# Patient Record
Sex: Male | Born: 1980
Health system: Southern US, Community
[De-identification: ages and names within clinical notes are randomized; demographics above are authoritative.]

---

## 2005-05-01 HISTORY — PX: OTHER SURGICAL HISTORY: SHX169

## 2016-01-11 ENCOUNTER — Encounter: Payer: Self-pay | Admitting: Adult Health

## 2016-01-11 ENCOUNTER — Ambulatory Visit (INDEPENDENT_AMBULATORY_CARE_PROVIDER_SITE_OTHER): Payer: BLUE CROSS/BLUE SHIELD | Admitting: Adult Health

## 2016-01-11 VITALS — BP 122/78 | HR 66 | Temp 98.4°F | Ht 68.0 in | Wt 241.4 lb

## 2016-01-11 DIAGNOSIS — Z0000 Encounter for general adult medical examination without abnormal findings: Secondary | ICD-10-CM | POA: Diagnosis not present

## 2016-01-11 DIAGNOSIS — Z3169 Encounter for other general counseling and advice on procreation: Secondary | ICD-10-CM | POA: Diagnosis not present

## 2016-01-11 NOTE — Progress Notes (Signed)
Patient presents to clinic today to establish care. He is a pleasant 35 year old male who  has no past medical history on file.   His last physical was in 2013.    Acute Concerns: Complete physical exam   Concern for infertility  - He has having concerns for infertility. He and his girlfriend have been "casually" trying to get pregnant for over a year. He is concerned that he may be infertile due to not being able to get pregnant. He would like to see someone to get tested.   Chronic Issues: None   Health Maintenance: Dental -- Does not do routine care Vision -- Does not do routine care  Immunizations --Needs tdap and flu vaccination- does not want  Exercise: Goes to [employer], lifts weight and cardio x 3 week  Diet: He continues to work on diet. He is not eating out as much.       No past medical history on file.  Past Surgical History:  Procedure Laterality Date  . right hand surgery   2007    No current outpatient prescriptions on file prior to visit.   No current facility-administered medications on file prior to visit.     Not on File  Family History  Problem Relation Age of Onset  . Arthritis Mother   . Diabetes Father     Social History   Social History  . Marital status: Single    Spouse name: N/A  . Number of children: N/A  . Years of education: N/A   Occupational History  . [employer]   Social History Main Topics  . Smoking status: Former Smoker    Types: Cigarettes    Quit date: 06/30/2014  . Smokeless tobacco: Never Used  . Alcohol use 1.8 oz/week    3 Cans of beer per week     Comment: 1x1 month socially   . Drug use:     Types: Marijuana     Comment: stopped in 2006  . Sexual activity: Yes    Partners: Female    Birth control/ protection: None   Other Topics Concern  . Not on file   Social History Narrative  . No narrative on file    Review of Systems  Constitutional: Negative.   HENT:  Negative.   Respiratory: Negative.   Cardiovascular: Negative.   Gastrointestinal: Negative.   Genitourinary: Negative.   Musculoskeletal: Negative.   Skin: Negative.   Neurological: Negative.   Endo/Heme/Allergies: Negative.   Psychiatric/Behavioral: Negative.   All other systems reviewed and are negative.   BP 122/78 (BP Location: Left Arm, Patient Position: Sitting, Cuff Size: Small)   Pulse 66   Temp 98.4 F (36.9 C) (Oral)   Ht 5\' 8"  (1.727 m)   Wt 241 lb 6.4 oz (109.5 kg)   SpO2 99%   BMI 36.70 kg/m   Physical Exam  Constitutional: He is oriented to person, place, and time and well-developed, well-nourished, and in no distress. No distress.  obese  HENT:  Head: Normocephalic and atraumatic.  Right Ear: External ear normal.  Left Ear: External ear normal.  Nose: Nose normal.  Mouth/Throat: Oropharynx is clear and moist.  Eyes: Conjunctivae and EOM are normal. Pupils are equal, round, and reactive to light. Right eye exhibits no discharge. Left eye exhibits no discharge. No scleral icterus.  Neck: Normal range of motion. Neck supple. No JVD present. No tracheal deviation present. No thyromegaly present.  Cardiovascular: Normal  rate, regular rhythm, normal heart sounds and intact distal pulses.  Exam reveals no gallop and no friction rub.   No murmur heard. Pulmonary/Chest: Effort normal and breath sounds normal. No stridor. No respiratory distress. He has no wheezes. He has no rales. He exhibits no tenderness.  Abdominal: Soft. Bowel sounds are normal. He exhibits no distension and no mass. There is no tenderness. There is no rebound and no guarding.  Musculoskeletal: Normal range of motion. He exhibits no edema, tenderness or deformity.  Lymphadenopathy:    He has no cervical adenopathy.  Neurological: He is alert and oriented to person, place, and time. He has normal reflexes. He displays normal reflexes. No cranial nerve deficit. He exhibits normal muscle tone. Gait  normal. Coordination normal. GCS score is 15.  Skin: Skin is warm and dry. No rash noted. He is not diaphoretic. No erythema. No pallor.  Psychiatric: Mood, memory, affect and judgment normal.  Nursing note and vitals reviewed.   Assessment/Plan:  1. Routine general medical examination at a health care facility - POCT Urinalysis Dipstick (Automated); Future - Basic metabolic panel; Future - CBC with Differential/Platelet; Future - Hemoglobin A1c; Future - Hepatic function panel; Future - Lipid panel; Future - TSH; Future - Urine cytology ancillary only; Future - RPR; Future - HSV(herpes smplx)abs-1+2(IgG+IgM)-bld; Future - HIV antibody; Future - Encouraged heart healthy diet and continued exercise - Follow up in one year or sooner if needed  2. Infertility counseling - Ambulatory referral to Urology  Shirline Freesory Kaytlynn Kochan, NP

## 2016-01-11 NOTE — Patient Instructions (Addendum)
It was great meeting you today   Please return to have your labs done  Someone from Urology will call you to schedule an appointment   Follow up with me in a year or sooner if needed  Health Maintenance, Male A healthy lifestyle and preventative care can promote health and wellness.  Maintain regular health, dental, and eye exams.  Eat a healthy diet. Foods like vegetables, fruits, whole grains, low-fat dairy products, and lean protein foods contain the nutrients you need and are low in calories. Decrease your intake of foods high in solid fats, added sugars, and salt. Get information about a proper diet from your health care provider, if necessary.  Regular physical exercise is one of the most important things you can do for your health. Most adults should get at least 150 minutes of moderate-intensity exercise (any activity that increases your heart rate and causes you to sweat) each week. In addition, most adults need muscle-strengthening exercises on 2 or more days a week.   Maintain a healthy weight. The body mass index (BMI) is a screening tool to identify possible weight problems. It provides an estimate of body fat based on height and weight. Your health care provider can find your BMI and can help you achieve or maintain a healthy weight. For males 20 years and older:  A BMI below 18.5 is considered underweight.  A BMI of 18.5 to 24.9 is normal.  A BMI of 25 to 29.9 is considered overweight.  A BMI of 30 and above is considered obese.  Maintain normal blood lipids and cholesterol by exercising and minimizing your intake of saturated fat. Eat a balanced diet with plenty of fruits and vegetables. Blood tests for lipids and cholesterol should begin at age 35 and be repeated every 5 years. If your lipid or cholesterol levels are high, you are over age 35, or you are at high risk for heart disease, you may need your cholesterol levels checked more frequently.Ongoing high lipid and  cholesterol levels should be treated with medicines if diet and exercise are not working.  If you smoke, find out from your health care provider how to quit. If you do not use tobacco, do not start.  Lung cancer screening is recommended for adults aged 55-80 years who are at high risk for developing lung cancer because of a history of smoking. A yearly low-dose CT scan of the lungs is recommended for people who have at least a 30-pack-year history of smoking and are current smokers or have quit within the past 15 years. A pack year of smoking is smoking an average of 1 pack of cigarettes a day for 1 year (for example, a 30-pack-year history of smoking could mean smoking 1 pack a day for 30 years or 2 packs a day for 15 years). Yearly screening should continue until the smoker has stopped smoking for at least 15 years. Yearly screening should be stopped for people who develop a health problem that would prevent them from having lung cancer treatment.  If you choose to drink alcohol, do not have more than 2 drinks per day. One drink is considered to be 12 oz (360 mL) of beer, 5 oz (150 mL) of wine, or 1.5 oz (45 mL) of liquor.  Avoid the use of street drugs. Do not share needles with anyone. Ask for help if you need support or instructions about stopping the use of drugs.  High blood pressure causes heart disease and increases the risk of  stroke. High blood pressure is more likely to develop in:  People who have blood pressure in the end of the normal range (100-139/85-89 mm Hg).  People who are overweight or obese.  People who are African American.  If you are 39-18 years of age, have your blood pressure checked every 3-5 years. If you are 56 years of age or older, have your blood pressure checked every year. You should have your blood pressure measured twice--once when you are at a hospital or clinic, and once when you are not at a hospital or clinic. Record the average of the two measurements. To  check your blood pressure when you are not at a hospital or clinic, you can use:  An automated blood pressure machine at a pharmacy.  A home blood pressure monitor.  If you are 5-62 years old, ask your health care provider if you should take aspirin to prevent heart disease.  Diabetes screening involves taking a blood sample to check your fasting blood sugar level. This should be done once every 3 years after age 64 if you are at a normal weight and without risk factors for diabetes. Testing should be considered at a younger age or be carried out more frequently if you are overweight and have at least 1 risk factor for diabetes.  Colorectal cancer can be detected and often prevented. Most routine colorectal cancer screening begins at the age of 70 and continues through age 13. However, your health care provider may recommend screening at an earlier age if you have risk factors for colon cancer. On a yearly basis, your health care provider may provide home test kits to check for hidden blood in the stool. A small camera at the end of a tube may be used to directly examine the colon (sigmoidoscopy or colonoscopy) to detect the earliest forms of colorectal cancer. Talk to your health care provider about this at age 75 when routine screening begins. A direct exam of the colon should be repeated every 5-10 years through age 50, unless early forms of precancerous polyps or small growths are found.  People who are at an increased risk for hepatitis B should be screened for this virus. You are considered at high risk for hepatitis B if:  You were born in a country where hepatitis B occurs often. Talk with your health care provider about which countries are considered high risk.  Your parents were born in a high-risk country and you have not received a shot to protect against hepatitis B (hepatitis B vaccine).  You have HIV or AIDS.  You use needles to inject street drugs.  You live with, or have sex  with, someone who has hepatitis B.  You are a man who has sex with other men (MSM).  You get hemodialysis treatment.  You take certain medicines for conditions like cancer, organ transplantation, and autoimmune conditions.  Hepatitis C blood testing is recommended for all people born from [birth year] through 1965 and any individual with known risk factors for hepatitis C.  Healthy men should no longer receive prostate-specific antigen (PSA) blood tests as part of routine cancer screening. Talk to your health care provider about prostate cancer screening.  Testicular cancer screening is not recommended for adolescents or adult males who have no symptoms. Screening includes self-exam, a health care provider exam, and other screening tests. Consult with your health care provider about any symptoms you have or any concerns you have about testicular cancer.  Practice safe sex. Use  condoms and avoid high-risk sexual practices to reduce the spread of sexually transmitted infections (STIs).  You should be screened for STIs, including gonorrhea and chlamydia if:  You are sexually active and are younger than 24 years.  You are older than 24 years, and your health care provider tells you that you are at risk for this type of infection.  Your sexual activity has changed since you were last screened, and you are at an increased risk for chlamydia or gonorrhea. Ask your health care provider if you are at risk.  If you are at risk of being infected with HIV, it is recommended that you take a prescription medicine daily to prevent HIV infection. This is called pre-exposure prophylaxis (PrEP). You are considered at risk if:  You are a man who has sex with other men (MSM).  You are a heterosexual man who is sexually active with multiple partners.  You take drugs by injection.  You are sexually active with a partner who has HIV.  Talk with your health care provider about whether you are at high risk of being  infected with HIV. If you choose to begin PrEP, you should first be tested for HIV. You should then be tested every 3 months for as long as you are taking PrEP.  Use sunscreen. Apply sunscreen liberally and repeatedly throughout the day. You should seek shade when your shadow is shorter than you. Protect yourself by wearing long sleeves, pants, a wide-brimmed hat, and sunglasses year round whenever you are outdoors.  Tell your health care provider of new moles or changes in moles, especially if there is a change in shape or color. Also, tell your health care provider if a mole is larger than the size of a pencil eraser.  A one-time screening for abdominal aortic aneurysm (AAA) and surgical repair of large AAAs by ultrasound is recommended for men aged 62-75 years who are current or former smokers.  Stay current with your vaccines (immunizations).   This information is not intended to replace advice given to you by your health care provider. Make sure you discuss any questions you have with your health care provider.   Document Released: 10/14/2007 Document Revised: 05/08/2014 Document Reviewed: 09/12/2010 Elsevier Interactive Patient Education [employer].

## 2016-01-11 NOTE — Progress Notes (Signed)
Pre visit review using our clinic review tool, if applicable. No additional management support is needed unless otherwise documented below in the visit note. 

## 2016-01-19 ENCOUNTER — Other Ambulatory Visit (HOSPITAL_COMMUNITY)
Admission: RE | Admit: 2016-01-19 | Discharge: 2016-01-19 | Disposition: A | Discharge status: Home / Self Care | Payer: BLUE CROSS/BLUE SHIELD | Source: Ambulatory Visit | Attending: Adult Health | Admitting: Adult Health

## 2016-01-19 ENCOUNTER — Other Ambulatory Visit (INDEPENDENT_AMBULATORY_CARE_PROVIDER_SITE_OTHER): Payer: BLUE CROSS/BLUE SHIELD

## 2016-01-19 DIAGNOSIS — Z0000 Encounter for general adult medical examination without abnormal findings: Secondary | ICD-10-CM | POA: Diagnosis not present

## 2016-01-19 DIAGNOSIS — Z113 Encounter for screening for infections with a predominantly sexual mode of transmission: Secondary | ICD-10-CM | POA: Diagnosis not present

## 2016-01-19 LAB — POC URINALSYSI DIPSTICK (AUTOMATED)
Bilirubin, UA: NEGATIVE
Glucose, UA: NEGATIVE
Ketones, UA: NEGATIVE
Leukocytes, UA: NEGATIVE
Nitrite, UA: NEGATIVE
Protein, UA: NEGATIVE
Spec Grav, UA: 1.015
Urobilinogen, UA: 0.2
pH, UA: 6.5

## 2016-01-19 LAB — HEPATIC FUNCTION PANEL
ALT: 53 U/L (ref 0–53)
AST: 23 U/L (ref 0–37)
Albumin: 4.2 g/dL (ref 3.5–5.2)
Alkaline Phosphatase: 47 U/L (ref 39–117)
Bilirubin, Direct: 0 mg/dL (ref 0.0–0.3)
Total Bilirubin: 0.7 mg/dL (ref 0.2–1.2)
Total Protein: 7.3 g/dL (ref 6.0–8.3)

## 2016-01-19 LAB — CBC WITH DIFFERENTIAL/PLATELET
Basophils Absolute: 0 10*3/uL (ref 0.0–0.1)
Basophils Relative: 0.6 % (ref 0.0–3.0)
Eosinophils Absolute: 0.2 10*3/uL (ref 0.0–0.7)
Eosinophils Relative: 4.6 % (ref 0.0–5.0)
HCT: 45.9 % (ref 39.0–52.0)
Hemoglobin: 15.9 g/dL (ref 13.0–17.0)
Lymphocytes Relative: 35.4 % (ref 12.0–46.0)
Lymphs Abs: 1.8 10*3/uL (ref 0.7–4.0)
MCHC: 34.8 g/dL (ref 30.0–36.0)
MCV: 83.8 fl (ref 78.0–100.0)
Monocytes Absolute: 0.3 10*3/uL (ref 0.1–1.0)
Monocytes Relative: 6.3 % (ref 3.0–12.0)
Neutro Abs: 2.7 10*3/uL (ref 1.4–7.7)
Neutrophils Relative %: 53.1 % (ref 43.0–77.0)
Platelets: 285 10*3/uL (ref 150.0–400.0)
RBC: 5.47 Mil/uL (ref 4.22–5.81)
RDW: 13.6 % (ref 11.5–15.5)
WBC: 5.2 10*3/uL (ref 4.0–10.5)

## 2016-01-19 LAB — LIPID PANEL
Cholesterol: 221 mg/dL — ABNORMAL HIGH (ref 0–200)
HDL: 44.6 mg/dL (ref >39.00)
LDL Cholesterol: 145 mg/dL — ABNORMAL HIGH (ref 0–99)
NonHDL: 176.17
Total CHOL/HDL Ratio: 5
Triglycerides: 154 mg/dL — ABNORMAL HIGH (ref 0.0–149.0)
VLDL: 30.8 mg/dL (ref 0.0–40.0)

## 2016-01-19 LAB — HEMOGLOBIN A1C: Hgb A1c MFr Bld: 5.6 % (ref 4.6–6.5)

## 2016-01-19 LAB — BASIC METABOLIC PANEL
BUN: 14 mg/dL (ref 6–23)
CO2: 27 mEq/L (ref 19–32)
Calcium: 8.8 mg/dL (ref 8.4–10.5)
Chloride: 103 mEq/L (ref 96–112)
Creatinine, Ser: 0.85 mg/dL (ref 0.40–1.50)
GFR: 108.84 mL/min (ref >60.00)
Glucose, Bld: 92 mg/dL (ref 70–99)
Potassium: 4.5 mEq/L (ref 3.5–5.1)
Sodium: 137 mEq/L (ref 135–145)

## 2016-01-19 LAB — TSH: TSH: 1.84 u[IU]/mL (ref 0.35–4.50)

## 2016-01-19 LAB — HIV ANTIBODY (ROUTINE TESTING W REFLEX): HIV 1&2 Ab, 4th Generation: NONREACTIVE

## 2016-01-20 LAB — RPR

## 2016-01-20 LAB — URINE CYTOLOGY ANCILLARY ONLY
Chlamydia: NEGATIVE
Neisseria Gonorrhea: NEGATIVE
Trichomonas: NEGATIVE

## 2016-01-21 LAB — HSV(HERPES SMPLX)ABS-I+II(IGG+IGM)-BLD
HSV 1 Glycoprotein G Ab, IgG: <0.9 Index (ref <0.90)
HSV 2 Glycoprotein G Ab, IgG: <0.9 Index (ref <0.90)
Herpes Simplex Vrs I&II-IgM Ab (EIA): 0.36 INDEX

## 2016-01-24 LAB — URINE CYTOLOGY ANCILLARY ONLY
Bacterial vaginitis: NEGATIVE
Candida vaginitis: NEGATIVE

## 2016-12-30 ENCOUNTER — Emergency Department (HOSPITAL_COMMUNITY): Payer: BLUE CROSS/BLUE SHIELD

## 2016-12-30 ENCOUNTER — Encounter (HOSPITAL_COMMUNITY): Payer: Self-pay | Admitting: *Deleted

## 2016-12-30 ENCOUNTER — Emergency Department (HOSPITAL_COMMUNITY)
Admission: EM | Admit: 2016-12-30 | Discharge: 2016-12-30 | Disposition: A | Discharge status: Home / Self Care | Payer: BLUE CROSS/BLUE SHIELD | Attending: Emergency Medicine | Admitting: Emergency Medicine

## 2016-12-30 DIAGNOSIS — R1011 Right upper quadrant pain: Secondary | ICD-10-CM | POA: Diagnosis present

## 2016-12-30 DIAGNOSIS — Z87891 Personal history of nicotine dependence: Secondary | ICD-10-CM | POA: Insufficient documentation

## 2016-12-30 LAB — URINALYSIS, ROUTINE W REFLEX MICROSCOPIC
Bacteria, UA: NONE SEEN
Bilirubin Urine: NEGATIVE
Glucose, UA: NEGATIVE mg/dL
Hgb urine dipstick: NEGATIVE
Ketones, ur: NEGATIVE mg/dL
Leukocytes, UA: NEGATIVE
Nitrite: NEGATIVE
Protein, ur: 30 mg/dL — AB
Specific Gravity, Urine: 1.027 (ref 1.005–1.030)
Squamous Epithelial / LPF: NONE SEEN
pH: 5 (ref 5.0–8.0)

## 2016-12-30 LAB — COMPREHENSIVE METABOLIC PANEL
ALT: 86 U/L — ABNORMAL HIGH (ref 17–63)
AST: 35 U/L (ref 15–41)
Albumin: 4.4 g/dL (ref 3.5–5.0)
Alkaline Phosphatase: 46 U/L (ref 38–126)
Anion gap: 8 (ref 5–15)
BUN: 15 mg/dL (ref 6–20)
CO2: 27 mmol/L (ref 22–32)
Calcium: 9 mg/dL (ref 8.9–10.3)
Chloride: 102 mmol/L (ref 101–111)
Creatinine, Ser: 0.91 mg/dL (ref 0.61–1.24)
GFR calc Af Amer: >60 mL/min (ref >60)
GFR calc non Af Amer: >60 mL/min (ref >60)
Glucose, Bld: 116 mg/dL — ABNORMAL HIGH (ref 65–99)
Potassium: 4 mmol/L (ref 3.5–5.1)
Sodium: 137 mmol/L (ref 135–145)
Total Bilirubin: 0.7 mg/dL (ref 0.3–1.2)
Total Protein: 7.7 g/dL (ref 6.5–8.1)

## 2016-12-30 LAB — CBC WITH DIFFERENTIAL/PLATELET
Basophils Absolute: 0 10*3/uL (ref 0.0–0.1)
Basophils Relative: 0 %
Eosinophils Absolute: 0.3 10*3/uL (ref 0.0–0.7)
Eosinophils Relative: 3 %
HCT: 42.4 % (ref 39.0–52.0)
Hemoglobin: 15 g/dL (ref 13.0–17.0)
Lymphocytes Relative: 19 %
Lymphs Abs: 1.6 10*3/uL (ref 0.7–4.0)
MCH: 29.2 pg (ref 26.0–34.0)
MCHC: 35.4 g/dL (ref 30.0–36.0)
MCV: 82.7 fL (ref 78.0–100.0)
Monocytes Absolute: 0.4 10*3/uL (ref 0.1–1.0)
Monocytes Relative: 5 %
Neutro Abs: 5.9 10*3/uL (ref 1.7–7.7)
Neutrophils Relative %: 73 %
Platelets: 263 10*3/uL (ref 150–400)
RBC: 5.13 MIL/uL (ref 4.22–5.81)
RDW: 12.7 % (ref 11.5–15.5)
WBC: 8.2 10*3/uL (ref 4.0–10.5)

## 2016-12-30 LAB — LIPASE, BLOOD: Lipase: 33 U/L (ref 11–51)

## 2016-12-30 MED ORDER — HYDROMORPHONE HCL 1 MG/ML IJ SOLN
1.0000 mg | Freq: Once | INTRAMUSCULAR | Status: AC
  Administered 2016-12-30: 1 mg via INTRAVENOUS
  Filled 2016-12-30: qty 1

## 2016-12-30 MED ORDER — ONDANSETRON HCL 4 MG/2ML IJ SOLN
4.0000 mg | Freq: Once | INTRAMUSCULAR | Status: AC
  Administered 2016-12-30: 4 mg via INTRAVENOUS
  Filled 2016-12-30: qty 2

## 2016-12-30 NOTE — ED Provider Notes (Signed)
[facility]-EMERGENCY DEPT Provider Note: Matthew Everett Matthew Coverdale, MD, FACEP  CSN: [id number] MRN: [id number] ARRIVAL: 12/30/16 at 0252 ROOM: [facility]17/[facility]17   CHIEF COMPLAINT  Flank Pain   HISTORY OF PRESENT ILLNESS  12/30/16 3:29 AM Matthew Everett is a 36 y.o. male with right upper quadrant abdominal pain onset this morning about 1 AM. He describes the pain as sharp and shooting and rates it as an 8 out of 10. It is somewhat worse with movement or palpation. He has had associated nausea and vomiting. He had a similar episode 4 days ago.  [place] [place] [facility] [place] reveals the patient has received no opioid prescriptions in the past year.  History reviewed. No pertinent past medical history.  Past Surgical History:  Procedure Laterality Date  . right hand surgery   2007    Family History  Problem Relation Age of Onset  . Arthritis Mother   . Diabetes Father     Social History  Substance Use Topics  . Smoking status: Former Smoker    Types: Cigarettes    Quit date: 06/30/2014  . Smokeless tobacco: Never Used  . Alcohol use 1.8 oz/week    3 Cans of beer per week     Comment: 1x1 month socially     Prior to Admission medications   Not on File    Allergies Patient has no known allergies.   REVIEW OF SYSTEMS  Negative except as noted here or in the History of Present Illness.   PHYSICAL EXAMINATION  Initial Vital Signs Blood pressure (!) 136/102, pulse 87, temperature 98.1 F (36.7 C), temperature source Oral, resp. rate 20, height 5\' 8"  (1.727 m), weight 110.2 kg (243 lb), SpO2 96 %.  Examination General: Well-developed, well-nourished male in no acute distress; appearance consistent with age of record HENT: normocephalic; atraumatic Eyes: pupils equal, round and reactive to light; extraocular muscles intact Neck: supple Heart: regular rate and rhythm Lungs: clear to auscultation bilaterally Abdomen: soft; nondistended; mild  right upper quadrant tenderness; no masses or hepatosplenomegaly; bowel sounds present GU: No CVA tenderness Extremities: No deformity; full range of motion; pulses normal Neurologic: Awake, alert and oriented; motor function intact in all extremities and symmetric; no facial droop Skin: Warm and dry Psychiatric: Normal mood and affect   RESULTS  Summary of this visit's results, reviewed by myself:   EKG Interpretation  Date/Time:    Ventricular Rate:    PR Interval:    QRS Duration:   QT Interval:    QTC Calculation:   R Axis:     Text Interpretation:        Laboratory Studies: Results for orders placed or performed during the hospital encounter of 12/30/16 (from the past 24 hour(s))  CBC with Differential/Platelet     Status: None   Collection Time: 12/30/16  3:41 AM  Result Value Ref Range   WBC 8.2 4.0 - 10.5 K/uL   RBC 5.13 4.22 - 5.81 MIL/uL   Hemoglobin 15.0 13.0 - 17.0 g/dL   HCT 78.242.4 95.639.0 - 21.352.0 %   MCV 82.7 78.0 - 100.0 fL   MCH 29.2 26.0 - 34.0 pg   MCHC 35.4 30.0 - 36.0 g/dL   RDW 08.612.7 57.811.5 - 46.915.5 %   Platelets 263 150 - 400 K/uL   Neutrophils Relative % 73 %   Neutro Abs 5.9 1.7 - 7.7 K/uL   Lymphocytes Relative 19 %   Lymphs Abs 1.6 0.7 - 4.0 K/uL  Monocytes Relative 5 %   Monocytes Absolute 0.4 0.1 - 1.0 K/uL   Eosinophils Relative 3 %   Eosinophils Absolute 0.3 0.0 - 0.7 K/uL   Basophils Relative 0 %   Basophils Absolute 0.0 0.0 - 0.1 K/uL  Lipase, blood     Status: None   Collection Time: 12/30/16  3:41 AM  Result Value Ref Range   Lipase 33 11 - 51 U/L  Comprehensive metabolic panel     Status: Abnormal   Collection Time: 12/30/16  3:41 AM  Result Value Ref Range   Sodium 137 135 - 145 mmol/L   Potassium 4.0 3.5 - 5.1 mmol/L   Chloride 102 101 - 111 mmol/L   CO2 27 22 - 32 mmol/L   Glucose, Bld 116 (H) 65 - 99 mg/dL   BUN 15 6 - 20 mg/dL   Creatinine, Ser 6.96 0.61 - 1.24 mg/dL   Calcium 9.0 8.9 - 29.5 mg/dL   Total Protein 7.7 6.5 -  8.1 g/dL   Albumin 4.4 3.5 - 5.0 g/dL   AST 35 15 - 41 U/L   ALT 86 (H) 17 - 63 U/L   Alkaline Phosphatase 46 38 - 126 U/L   Total Bilirubin 0.7 0.3 - 1.2 mg/dL   GFR calc non Af Amer >60 >60 mL/min   GFR calc Af Amer >60 >60 mL/min   Anion gap 8 5 - 15  Urinalysis, Routine w reflex microscopic     Status: Abnormal   Collection Time: 12/30/16  3:43 AM  Result Value Ref Range   Color, Urine YELLOW YELLOW   APPearance HAZY (A) CLEAR   Specific Gravity, Urine 1.027 1.005 - 1.030   pH 5.0 5.0 - 8.0   Glucose, UA NEGATIVE NEGATIVE mg/dL   Hgb urine dipstick NEGATIVE NEGATIVE   Bilirubin Urine NEGATIVE NEGATIVE   Ketones, ur NEGATIVE NEGATIVE mg/dL   Protein, ur 30 (A) NEGATIVE mg/dL   Nitrite NEGATIVE NEGATIVE   Leukocytes, UA NEGATIVE NEGATIVE   RBC / HPF 0-5 0 - 5 RBC/hpf   WBC, UA 0-5 0 - 5 WBC/hpf   Bacteria, UA NONE SEEN NONE SEEN   Squamous Epithelial / LPF NONE SEEN NONE SEEN   Mucus PRESENT    Sperm, UA PRESENT    Imaging Studies: Ct Renal Stone Study  Result Date: 12/30/2016 CLINICAL DATA:  36 y/o  M; right flank pain. EXAM: CT ABDOMEN AND PELVIS WITHOUT CONTRAST TECHNIQUE: Multidetector CT imaging of the abdomen and pelvis was performed following the standard protocol without IV contrast. COMPARISON:  None. FINDINGS: Lower chest: No acute abnormality. Hepatobiliary: No focal liver abnormality is seen. Hepatic steatosis. No gallstones, gallbladder wall thickening, or biliary dilatation. Pancreas: Unremarkable. No pancreatic ductal dilatation or surrounding inflammatory changes. Spleen: Normal in size without focal abnormality. Adrenals/Urinary Tract: Normal adrenal glands. subcentimeter right kidney interpolar fat attenuating focus compatible with angiomyolipoma. No urinary stone disease, hydronephrosis, or additional focal kidney lesion. Decompressed bladder. Stomach/Bowel: Stomach is within normal limits. Appendix appears normal. No evidence of bowel wall thickening, distention,  or inflammatory changes. Vascular/Lymphatic: No significant vascular findings are present. No enlarged abdominal or pelvic lymph nodes. Reproductive: Prostate is unremarkable. Other: No abdominal wall hernia or abnormality. No abdominopelvic ascites. Musculoskeletal: No acute or significant osseous findings. IMPRESSION: 1. No acute process identified. No urinary stone disease or hydronephrosis. 2. Hepatic steatosis. Electronically Signed   By: Mitzi Hansen M.D.   On: 12/30/2016 05:14    ED COURSE  Nursing notes and  initial vitals signs, including pulse oximetry, reviewed.  Vitals:   12/30/16 0306 12/30/16 0308  BP: (!) 136/102   Pulse: 87   Resp: 20   Temp: 98.1 F (36.7 C)   TempSrc: Oral   SpO2: 96%   Weight:  110.2 kg (243 lb)  Height:  5\' 8"  (1.727 m)   5:08 AM Patient denies pain at this time. His abdomen is soft and nontender. Laboratory studies are unremarkable.   5:27 AM The patient does not wish to stay for an abdominal ultrasound. His symptomatology is suggestive of biliary colic although he had an unremarkable Murphy's sign on exam. He would like to be set up for an outpatient ultrasound.  PROCEDURES    ED DIAGNOSES     ICD-10-CM   1. Right upper quadrant abdominal pain R10.11        Annibelle Brazie, Jonny Ruiz, MD 12/30/16 [phone]

## 2016-12-30 NOTE — ED Triage Notes (Signed)
Patient is alert and oriented x4.  He is being seen for right side flank pain that started two days ago.  Patient states that the pain went away and came back this morning as a sharp shooting pain.  Currently he rates his pain 8 of 10.

## 2016-12-30 NOTE — ED Notes (Signed)
Patient aware UA is needed.

## 2017-01-08 ENCOUNTER — Ambulatory Visit (HOSPITAL_COMMUNITY)
Admission: RE | Admit: 2017-01-08 | Discharge: 2017-01-08 | Disposition: A | Discharge status: Home / Self Care | Payer: BLUE CROSS/BLUE SHIELD | Source: Ambulatory Visit | Attending: Emergency Medicine | Admitting: Emergency Medicine

## 2017-01-08 DIAGNOSIS — K760 Fatty (change of) liver, not elsewhere classified: Secondary | ICD-10-CM | POA: Insufficient documentation

## 2017-01-08 DIAGNOSIS — R1011 Right upper quadrant pain: Secondary | ICD-10-CM | POA: Insufficient documentation

## 2017-01-08 DIAGNOSIS — K824 Cholesterolosis of gallbladder: Secondary | ICD-10-CM | POA: Insufficient documentation

## 2017-01-19 ENCOUNTER — Encounter: Payer: Self-pay | Admitting: Adult Health

## 2017-02-02 ENCOUNTER — Encounter: Payer: Self-pay | Admitting: Adult Health

## 2017-02-13 ENCOUNTER — Encounter: Payer: Self-pay | Admitting: Adult Health

## 2017-02-15 ENCOUNTER — Ambulatory Visit (INDEPENDENT_AMBULATORY_CARE_PROVIDER_SITE_OTHER): Payer: BLUE CROSS/BLUE SHIELD | Admitting: Adult Health

## 2017-02-15 ENCOUNTER — Encounter: Payer: Self-pay | Admitting: Adult Health

## 2017-02-15 VITALS — BP 134/90 | Temp 98.3°F | Ht 67.25 in | Wt 252.0 lb

## 2017-02-15 DIAGNOSIS — E669 Obesity, unspecified: Secondary | ICD-10-CM | POA: Diagnosis not present

## 2017-02-15 DIAGNOSIS — Z0000 Encounter for general adult medical examination without abnormal findings: Secondary | ICD-10-CM | POA: Diagnosis not present

## 2017-02-15 DIAGNOSIS — Z23 Encounter for immunization: Secondary | ICD-10-CM

## 2017-02-15 DIAGNOSIS — K760 Fatty (change of) liver, not elsewhere classified: Secondary | ICD-10-CM | POA: Insufficient documentation

## 2017-02-15 LAB — BASIC METABOLIC PANEL
BUN: 14 mg/dL (ref 6–23)
CO2: 28 mEq/L (ref 19–32)
Calcium: 9.7 mg/dL (ref 8.4–10.5)
Chloride: 101 mEq/L (ref 96–112)
Creatinine, Ser: 0.93 mg/dL (ref 0.40–1.50)
GFR: 97.51 mL/min (ref >60.00)
Glucose, Bld: 98 mg/dL (ref 70–99)
Potassium: 5.2 mEq/L — ABNORMAL HIGH (ref 3.5–5.1)
Sodium: 137 mEq/L (ref 135–145)

## 2017-02-15 LAB — HEPATIC FUNCTION PANEL
ALT: 75 U/L — ABNORMAL HIGH (ref 0–53)
AST: 53 U/L — ABNORMAL HIGH (ref 0–37)
Albumin: 4.5 g/dL (ref 3.5–5.2)
Alkaline Phosphatase: 43 U/L (ref 39–117)
Bilirubin, Direct: 0.1 mg/dL (ref 0.0–0.3)
Total Bilirubin: 0.6 mg/dL (ref 0.2–1.2)
Total Protein: 7.3 g/dL (ref 6.0–8.3)

## 2017-02-15 LAB — LIPID PANEL
Cholesterol: 246 mg/dL — ABNORMAL HIGH (ref 0–200)
HDL: 50.7 mg/dL (ref >39.00)
LDL Cholesterol: 162 mg/dL — ABNORMAL HIGH (ref 0–99)
NonHDL: 195.61
Total CHOL/HDL Ratio: 5
Triglycerides: 168 mg/dL — ABNORMAL HIGH (ref 0.0–149.0)
VLDL: 33.6 mg/dL (ref 0.0–40.0)

## 2017-02-15 LAB — CBC WITH DIFFERENTIAL/PLATELET
Basophils Absolute: 0 10*3/uL (ref 0.0–0.1)
Basophils Relative: 0.8 % (ref 0.0–3.0)
Eosinophils Absolute: 0.3 10*3/uL (ref 0.0–0.7)
Eosinophils Relative: 5.5 % — ABNORMAL HIGH (ref 0.0–5.0)
HCT: 47 % (ref 39.0–52.0)
Hemoglobin: 15.6 g/dL (ref 13.0–17.0)
Lymphocytes Relative: 27.7 % (ref 12.0–46.0)
Lymphs Abs: 1.5 10*3/uL (ref 0.7–4.0)
MCHC: 33.2 g/dL (ref 30.0–36.0)
MCV: 87.4 fl (ref 78.0–100.0)
Monocytes Absolute: 0.4 10*3/uL (ref 0.1–1.0)
Monocytes Relative: 6.7 % (ref 3.0–12.0)
Neutro Abs: 3.1 10*3/uL (ref 1.4–7.7)
Neutrophils Relative %: 59.3 % (ref 43.0–77.0)
Platelets: 278 10*3/uL (ref 150.0–400.0)
RBC: 5.38 Mil/uL (ref 4.22–5.81)
RDW: 13.9 % (ref 11.5–15.5)
WBC: 5.3 10*3/uL (ref 4.0–10.5)

## 2017-02-15 LAB — HEMOGLOBIN A1C: Hgb A1c MFr Bld: 5.8 % (ref 4.6–6.5)

## 2017-02-15 LAB — TSH: TSH: 2.21 u[IU]/mL (ref 0.35–4.50)

## 2017-02-15 NOTE — Patient Instructions (Signed)
It was great seeing you today   I will follow up with you regarding your lab work   Please work on weight loss through diet and exercise   Health Maintenance, Male A healthy lifestyle and preventative care can promote health and wellness.  Maintain regular health, dental, and eye exams.  Eat a healthy diet. Foods like vegetables, fruits, whole grains, low-fat dairy products, and lean protein foods contain the nutrients you need and are low in calories. Decrease your intake of foods high in solid fats, added sugars, and salt. Get information about a proper diet from your health care provider, if necessary.  Regular physical exercise is one of the most important things you can do for your health. Most adults should get at least 150 minutes of moderate-intensity exercise (any activity that increases your heart rate and causes you to sweat) each week. In addition, most adults need muscle-strengthening exercises on 2 or more days a week.   Maintain a healthy weight. The body mass index (BMI) is a screening tool to identify possible weight problems. It provides an estimate of body fat based on height and weight. Your health care provider can find your BMI and can help you achieve or maintain a healthy weight. For males 20 years and older:  A BMI below 18.5 is considered underweight.  A BMI of 18.5 to 24.9 is normal.  A BMI of 25 to 29.9 is considered overweight.  A BMI of 30 and above is considered obese.  Maintain normal blood lipids and cholesterol by exercising and minimizing your intake of saturated fat. Eat a balanced diet with plenty of fruits and vegetables. Blood tests for lipids and cholesterol should begin at age 36 and be repeated every 5 years. If your lipid or cholesterol levels are high, you are over age 550, or you are at high risk for heart disease, you may need your cholesterol levels checked more frequently.Ongoing high lipid and cholesterol levels should be treated with  medicines if diet and exercise are not working.  If you smoke, find out from your health care provider how to quit. If you do not use tobacco, do not start.  Lung cancer screening is recommended for adults aged 55-80 years who are at high risk for developing lung cancer because of a history of smoking. A yearly low-dose CT scan of the lungs is recommended for people who have at least a 30-pack-year history of smoking and are current smokers or have quit within the past 15 years. A pack year of smoking is smoking an average of 1 pack of cigarettes a day for 1 year (for example, a 30-pack-year history of smoking could mean smoking 1 pack a day for 30 years or 2 packs a day for 15 years). Yearly screening should continue until the smoker has stopped smoking for at least 15 years. Yearly screening should be stopped for people who develop a health problem that would prevent them from having lung cancer treatment.  If you choose to drink alcohol, do not have more than 2 drinks per day. One drink is considered to be 12 oz (360 mL) of beer, 5 oz (150 mL) of wine, or 1.5 oz (45 mL) of liquor.  Avoid the use of street drugs. Do not share needles with anyone. Ask for help if you need support or instructions about stopping the use of drugs.  High blood pressure causes heart disease and increases the risk of stroke. High blood pressure is more likely to develop  in:  People who have blood pressure in the end of the normal range (100-139/85-89 mm Hg).  People who are overweight or obese.  People who are African American.  If you are 76-70 years of age, have your blood pressure checked every 3-5 years. If you are 36 years of age or older, have your blood pressure checked every year. You should have your blood pressure measured twice--once when you are at a hospital or clinic, and once when you are not at a hospital or clinic. Record the average of the two measurements. To check your blood pressure when you are not  at a hospital or clinic, you can use:  An automated blood pressure machine at a pharmacy.  A home blood pressure monitor.  If you are 52-23 years old, ask your health care provider if you should take aspirin to prevent heart disease.  Diabetes screening involves taking a blood sample to check your fasting blood sugar level. This should be done once every 3 years after age 74 if you are at a normal weight and without risk factors for diabetes. Testing should be considered at a younger age or be carried out more frequently if you are overweight and have at least 1 risk factor for diabetes.  Colorectal cancer can be detected and often prevented. Most routine colorectal cancer screening begins at the age of 26 and continues through age 88. However, your health care provider may recommend screening at an earlier age if you have risk factors for colon cancer. On a yearly basis, your health care provider may provide home test kits to check for hidden blood in the stool. A small camera at the end of a tube may be used to directly examine the colon (sigmoidoscopy or colonoscopy) to detect the earliest forms of colorectal cancer. Talk to your health care provider about this at age 5 when routine screening begins. A direct exam of the colon should be repeated every 5-10 years through age 75, unless early forms of precancerous polyps or small growths are found.  People who are at an increased risk for hepatitis B should be screened for this virus. You are considered at high risk for hepatitis B if:  You were born in a country where hepatitis B occurs often. Talk with your health care provider about which countries are considered high risk.  Your parents were born in a high-risk country and you have not received a shot to protect against hepatitis B (hepatitis B vaccine).  You have HIV or AIDS.  You use needles to inject street drugs.  You live with, or have sex with, someone who has hepatitis B.  You  are a man who has sex with other men (MSM).  You get hemodialysis treatment.  You take certain medicines for conditions like cancer, organ transplantation, and autoimmune conditions.  Hepatitis C blood testing is recommended for all people born from [birth year] through 1965 and any individual with known risk factors for hepatitis C.  Healthy men should no longer receive prostate-specific antigen (PSA) blood tests as part of routine cancer screening. Talk to your health care provider about prostate cancer screening.  Testicular cancer screening is not recommended for adolescents or adult males who have no symptoms. Screening includes self-exam, a health care provider exam, and other screening tests. Consult with your health care provider about any symptoms you have or any concerns you have about testicular cancer.  Practice safe sex. Use condoms and avoid high-risk sexual practices to reduce the  spread of sexually transmitted infections (STIs).  You should be screened for STIs, including gonorrhea and chlamydia if:  You are sexually active and are younger than 24 years.  You are older than 24 years, and your health care provider tells you that you are at risk for this type of infection.  Your sexual activity has changed since you were last screened, and you are at an increased risk for chlamydia or gonorrhea. Ask your health care provider if you are at risk.  If you are at risk of being infected with HIV, it is recommended that you take a prescription medicine daily to prevent HIV infection. This is called pre-exposure prophylaxis (PrEP). You are considered at risk if:  You are a man who has sex with other men (MSM).  You are a heterosexual man who is sexually active with multiple partners.  You take drugs by injection.  You are sexually active with a partner who has HIV.  Talk with your health care provider about whether you are at high risk of being infected with HIV. If you choose to begin  PrEP, you should first be tested for HIV. You should then be tested every 3 months for as long as you are taking PrEP.  Use sunscreen. Apply sunscreen liberally and repeatedly throughout the day. You should seek shade when your shadow is shorter than you. Protect yourself by wearing long sleeves, pants, a wide-brimmed hat, and sunglasses year round whenever you are outdoors.  Tell your health care provider of new moles or changes in moles, especially if there is a change in shape or color. Also, tell your health care provider if a mole is larger than the size of a pencil eraser.  A one-time screening for abdominal aortic aneurysm (AAA) and surgical repair of large AAAs by ultrasound is recommended for men aged 60-75 years who are current or former smokers.  Stay current with your vaccines (immunizations).   This information is not intended to replace advice given to you by your health care provider. Make sure you discuss any questions you have with your health care provider.   Document Released: 10/14/2007 Document Revised: 05/08/2014 Document Reviewed: 09/12/2010 Elsevier Interactive Patient Education [employer].

## 2017-02-15 NOTE — Progress Notes (Signed)
Subjective:    Patient ID: Matthew Everett, male    DOB: [birth year], 36 y.o.   MRN: [id number]  HPI  Patient presents for yearly preventative medicine examination. He is a pleasant 36 year old male who  has no past medical history on file.  He takes no medications   All immunizations and health maintenance protocols were reviewed with the patient and needed orders were placed.  Appropriate screening laboratory values were ordered for the patient including screening of hyperlipidemia, renal function and hepatic function.  Medication reconciliation,  past medical history, social history, problem list and allergies were reviewed in detail with the patient  Goals were established with regard to weight loss, exercise, and  diet in compliance with medications. He is not exercising as much due to work responsibilities. He is trying to eat out less and cook more at home.   Wt Readings from Last 3 Encounters:  02/15/17 252 lb (114.3 kg)  12/30/16 243 lb (110.2 kg)  01/11/16 241 lb 6.4 oz (109.5 kg)    He has no acute complaints today   His only interval history was an ER visit last month for abdominal pain. His ultrasound showed   Increased hepatic echotexture compatible with fatty infiltrative change.  Gallbladder polyps as well as sludge. No sonographic evidence of acute cholecystitis.  He did have elevated ALT at 86   He denies any abdominal pain since leaving the ER  Review of Systems  Constitutional: Negative.   HENT: Negative.   Eyes: Negative.   Respiratory: Negative.   Cardiovascular: Negative.   Gastrointestinal: Negative.   Endocrine: Negative.   Genitourinary: Negative.   Musculoskeletal: Negative.   Skin: Negative.   Allergic/Immunologic: Negative.   Neurological: Negative.   Hematological: Negative.   Psychiatric/Behavioral: Negative.   All other systems reviewed and are negative.  No past medical history on file.  Social History   Social  History  . Marital status: Single    Spouse name: N/A  . Number of children: N/A  . Years of education: N/A   Occupational History  . [employer]   Social History Main Topics  . Smoking status: Former Smoker    Types: Cigarettes    Quit date: 06/30/2014  . Smokeless tobacco: Never Used  . Alcohol use 1.8 oz/week    3 Cans of beer per week     Comment: 1x1 month socially   . Drug use: Yes    Types: Marijuana     Comment: stopped in 2006  . Sexual activity: Yes    Partners: Female    Birth control/ protection: None   Other Topics Concern  . Not on file   Social History Narrative   Works as a Film/video editor for [employer]    Relationship       Ride his Lane [employer] and [employer].    Past Surgical History:  Procedure Laterality Date  . right hand surgery   2007    Family History  Problem Relation Age of Onset  . Arthritis Mother   . Diabetes Father     No Known Allergies  No current outpatient prescriptions on file prior to visit.   No current facility-administered medications on file prior to visit.     BP 134/90 (BP Location: Left Arm)   Temp 98.3 F (36.8 C) (Oral)   Ht 5' 7.25" (1.708 m)   Wt 252 lb (114.3 kg)   BMI 39.18 kg/m  Objective:   Physical Exam  Constitutional: He is oriented to person, place, and time. He appears well-developed and well-nourished. No distress.  Obese   HENT:  Head: Normocephalic and atraumatic.  Right Ear: External ear normal.  Left Ear: External ear normal.  Nose: Nose normal.  Mouth/Throat: Oropharynx is clear and moist. No oropharyngeal exudate.  Eyes: Pupils are equal, round, and reactive to light. Conjunctivae and EOM are normal. Right eye exhibits no discharge. Left eye exhibits no discharge. No scleral icterus.  Neck: Normal range of motion. Neck supple. No JVD present. No tracheal deviation present. No thyromegaly present.  Cardiovascular: Normal rate, regular rhythm, normal heart sounds  and intact distal pulses.  Exam reveals no gallop and no friction rub.   No murmur heard. Pulmonary/Chest: Effort normal and breath sounds normal. No stridor. No respiratory distress. He has no wheezes. He has no rales. He exhibits no tenderness.  Abdominal: Soft. Bowel sounds are normal. He exhibits no distension and no mass. There is no tenderness. There is no rebound and no guarding.  Musculoskeletal: Normal range of motion. He exhibits no edema, tenderness or deformity.  Lymphadenopathy:    He has no cervical adenopathy.  Neurological: He is alert and oriented to person, place, and time. He has normal reflexes. He displays normal reflexes. No cranial nerve deficit. He exhibits normal muscle tone. Coordination normal.  Skin: Skin is warm and dry. No rash noted. He is not diaphoretic. No erythema. No pallor.  Small areas of patchy eczema on scalp   Psychiatric: He has a normal mood and affect. His behavior is normal. Judgment and thought content normal.  Nursing note and vitals reviewed.     Assessment & Plan:  1. Routine general medical examination at a health care facility - Needs to work on weight loss through diet and exercise  - Basic metabolic panel - CBC with Differential/Platelet - Hepatic function panel - Lipid panel - TSH - Hemoglobin A1c  2. Need for Tdap vaccination  - Tdap vaccine greater than or equal to 7yo IM  3. Obesity (BMI 35.0-39.9 without comorbidity) - Life style modifications   Shirline Freesory Jeanet Lupe, NP

## 2017-02-19 ENCOUNTER — Encounter: Payer: Self-pay | Admitting: Adult Health

## 2017-02-20 ENCOUNTER — Other Ambulatory Visit: Payer: Self-pay | Admitting: Adult Health

## 2017-12-31 ENCOUNTER — Encounter: Payer: Self-pay | Admitting: Adult Health

## 2018-07-07 ENCOUNTER — Emergency Department (HOSPITAL_COMMUNITY)
Admission: EM | Admit: 2018-07-07 | Discharge: 2018-07-07 | Disposition: A | Discharge status: Home / Self Care | Payer: BLUE CROSS/BLUE SHIELD | Attending: Emergency Medicine | Admitting: Emergency Medicine

## 2018-07-07 ENCOUNTER — Emergency Department (HOSPITAL_COMMUNITY): Payer: BLUE CROSS/BLUE SHIELD

## 2018-07-07 ENCOUNTER — Other Ambulatory Visit: Payer: Self-pay

## 2018-07-07 ENCOUNTER — Encounter (HOSPITAL_COMMUNITY): Payer: Self-pay | Admitting: *Deleted

## 2018-07-07 DIAGNOSIS — R6883 Chills (without fever): Secondary | ICD-10-CM | POA: Diagnosis not present

## 2018-07-07 DIAGNOSIS — R1010 Upper abdominal pain, unspecified: Secondary | ICD-10-CM | POA: Insufficient documentation

## 2018-07-07 DIAGNOSIS — R110 Nausea: Secondary | ICD-10-CM | POA: Insufficient documentation

## 2018-07-07 DIAGNOSIS — Z87891 Personal history of nicotine dependence: Secondary | ICD-10-CM | POA: Diagnosis not present

## 2018-07-07 LAB — CBC
HCT: 48.2 % (ref 39.0–52.0)
Hemoglobin: 15.5 g/dL (ref 13.0–17.0)
MCH: 28.4 pg (ref 26.0–34.0)
MCHC: 32.2 g/dL (ref 30.0–36.0)
MCV: 88.3 fL (ref 80.0–100.0)
Platelets: 267 10*3/uL (ref 150–400)
RBC: 5.46 MIL/uL (ref 4.22–5.81)
RDW: 12.6 % (ref 11.5–15.5)
WBC: 9.9 10*3/uL (ref 4.0–10.5)
nRBC: 0 % (ref 0.0–0.2)

## 2018-07-07 LAB — URINALYSIS, ROUTINE W REFLEX MICROSCOPIC
Bilirubin Urine: NEGATIVE
Glucose, UA: NEGATIVE mg/dL
Hgb urine dipstick: NEGATIVE
Ketones, ur: NEGATIVE mg/dL
Leukocytes,Ua: NEGATIVE
Nitrite: NEGATIVE
Protein, ur: NEGATIVE mg/dL
Specific Gravity, Urine: 1.027 (ref 1.005–1.030)
pH: 7 (ref 5.0–8.0)

## 2018-07-07 LAB — COMPREHENSIVE METABOLIC PANEL
ALT: 95 U/L — ABNORMAL HIGH (ref 0–44)
AST: 40 U/L (ref 15–41)
Albumin: 4.8 g/dL (ref 3.5–5.0)
Alkaline Phosphatase: 45 U/L (ref 38–126)
Anion gap: 9 (ref 5–15)
BUN: 13 mg/dL (ref 6–20)
CO2: 24 mmol/L (ref 22–32)
Calcium: 8.8 mg/dL — ABNORMAL LOW (ref 8.9–10.3)
Chloride: 104 mmol/L (ref 98–111)
Creatinine, Ser: 0.92 mg/dL (ref 0.61–1.24)
GFR calc Af Amer: >60 mL/min (ref >60)
GFR calc non Af Amer: >60 mL/min (ref >60)
Glucose, Bld: 132 mg/dL — ABNORMAL HIGH (ref 70–99)
Potassium: 3.8 mmol/L (ref 3.5–5.1)
Sodium: 137 mmol/L (ref 135–145)
Total Bilirubin: 0.9 mg/dL (ref 0.3–1.2)
Total Protein: 8.2 g/dL — ABNORMAL HIGH (ref 6.5–8.1)

## 2018-07-07 LAB — LIPASE, BLOOD: Lipase: 33 U/L (ref 11–51)

## 2018-07-07 MED ORDER — IOPAMIDOL (ISOVUE-300) INJECTION 61%
INTRAVENOUS | Status: AC
  Filled 2018-07-07: qty 100

## 2018-07-07 MED ORDER — SODIUM CHLORIDE (PF) 0.9 % IJ SOLN
INTRAMUSCULAR | Status: AC
  Filled 2018-07-07: qty 50

## 2018-07-07 MED ORDER — SODIUM CHLORIDE 0.9% FLUSH: 3.0000 mL | Freq: Once | INTRAVENOUS | Status: DC

## 2018-07-07 MED ORDER — ONDANSETRON 4 MG PO TBDP
4.0000 mg | ORAL_TABLET | Freq: Once | ORAL | Status: AC | PRN
  Administered 2018-07-07: 4 mg via ORAL
  Filled 2018-07-07: qty 1

## 2018-07-07 MED ORDER — HYDROMORPHONE HCL 1 MG/ML IJ SOLN
1.0000 mg | Freq: Once | INTRAMUSCULAR | Status: AC
  Administered 2018-07-07: 1 mg via INTRAVENOUS
  Filled 2018-07-07: qty 1

## 2018-07-07 MED ORDER — IOPAMIDOL (ISOVUE-300) INJECTION 61%
100.0000 mL | Freq: Once | INTRAVENOUS | Status: AC | PRN
  Administered 2018-07-07: 100 mL via INTRAVENOUS

## 2018-07-07 MED ORDER — OXYCODONE HCL 5 MG PO TABS: 5.0000 mg | ORAL_TABLET | ORAL | 0 refills | Status: AC | PRN

## 2018-07-07 MED ORDER — SODIUM CHLORIDE 0.9 % IV BOLUS
1000.0000 mL | Freq: Once | INTRAVENOUS | Status: AC
  Administered 2018-07-07: 1000 mL via INTRAVENOUS

## 2018-07-07 MED ORDER — FENTANYL CITRATE (PF) 100 MCG/2ML IJ SOLN
50.0000 ug | INTRAMUSCULAR | Status: DC | PRN
  Administered 2018-07-07: 50 ug via INTRAVENOUS
  Filled 2018-07-07: qty 2

## 2018-07-07 MED ORDER — ONDANSETRON HCL 4 MG/2ML IJ SOLN
4.0000 mg | Freq: Once | INTRAMUSCULAR | Status: AC
  Administered 2018-07-07: 4 mg via INTRAVENOUS
  Filled 2018-07-07: qty 2

## 2018-07-07 MED ORDER — ONDANSETRON 8 MG PO TBDP: 8.0000 mg | ORAL_TABLET | Freq: Three times a day (TID) | ORAL | 0 refills | Status: AC | PRN

## 2018-07-07 NOTE — ED Notes (Signed)
Pt. Unable to urinate at this time. Will collect urine specimen when pt. Voids. Nurse aware.  

## 2018-07-07 NOTE — ED Notes (Signed)
Bed: WA11 Expected date:  Expected time:  Means of arrival:  Comments: Hold triage 1  

## 2018-07-07 NOTE — ED Notes (Signed)
Patient transported to Ultrasound 

## 2018-07-07 NOTE — Discharge Instructions (Signed)
Return to the [facility] emergency department for reevaluation by myself tomorrow if your symptoms persist

## 2018-07-07 NOTE — ED Triage Notes (Signed)
Pt stated "I think it's gallbladder.  I had it a couple of years ago.  It hurts in my right upper abd and goes around to my back.  I started vomiting around 0300. Have vomited x 5.

## 2018-07-07 NOTE — ED Provider Notes (Signed)
[facility]-EMERGENCY DEPT Provider Note   CSN: [id number] Arrival date & time: 07/07/18  0541    History   Chief Complaint Chief Complaint  Patient presents with  . Emesis    HPI Matthew Everett is a 38 y.o. male.     HPI 38 year old male presents the emergency department severe acute onset right upper quadrant abdominal pain.  Has had intermittent upper abdominal pain before in the past.  He states this pain began abruptly around 3 AM.  He is vomited 5 times since then.  Chills without documented fever.  Nausea now.  Ongoing pain now.  Pain is severe in severity.  Patient reports no history of gallstones however review of the chart demonstrates gallbladder sludge noted several years ago on ultrasound.  He was unaware of this.  No prior endoscopy.  Patient states he had been in his normal state of health up until this point   History reviewed. No pertinent past medical history.  Patient Active Problem List   Diagnosis Date Noted  . Fatty liver disease, nonalcoholic 02/15/2017    Past Surgical History:  Procedure Laterality Date  . right hand surgery   2007        Home Medications    Prior to Admission medications   Not on File    Family History Family History  Problem Relation Age of Onset  . Arthritis Mother   . Diabetes Father     Social History Social History   Tobacco Use  . Smoking status: Former Smoker    Types: Cigarettes    Last attempt to quit: 06/30/2014    Years since quitting: 4.0  . Smokeless tobacco: Never Used  Substance Use Topics  . Alcohol use: Yes    Alcohol/week: 3.0 standard drinks    Types: 3 Cans of beer per week    Comment: 1x1 month socially   . Drug use: Not Currently    Types: Marijuana    Comment: stopped in 2006     Allergies   Patient has no known allergies.   Review of Systems Review of Systems  All other systems reviewed and are negative.    Physical Exam Updated Vital Signs BP  (!) 149/114 (BP Location: Left Arm)   Pulse (!) 103   Temp 98.1 F (36.7 C) (Oral)   Resp 18   Ht 5\' 8"  (1.727 m)   Wt 108.9 kg   SpO2 100%   BMI 36.49 kg/m   Physical Exam Vitals signs and nursing note reviewed.  Constitutional:      Appearance: He is well-developed.  HENT:     Head: Normocephalic and atraumatic.  Neck:     Musculoskeletal: Normal range of motion.  Cardiovascular:     Rate and Rhythm: Normal rate and regular rhythm.     Heart sounds: Normal heart sounds.  Pulmonary:     Effort: Pulmonary effort is normal. No respiratory distress.     Breath sounds: Normal breath sounds.  Abdominal:     General: There is no distension.     Palpations: Abdomen is soft.     Comments: Mild epigastric and right upper quadrant abdominal tenderness without guarding or rebound.  No peritoneal signs.  Musculoskeletal: Normal range of motion.  Skin:    General: Skin is warm and dry.  Neurological:     Mental Status: He is alert and oriented to person, place, and time.  Psychiatric:        Judgment: Judgment normal.  ED Treatments / Results  Labs (all labs ordered are listed, but only abnormal results are displayed) Labs Reviewed  COMPREHENSIVE METABOLIC PANEL - Abnormal; Notable for the following components:      Result Value   Glucose, Bld 132 (*)    Calcium 8.8 (*)    Total Protein 8.2 (*)    ALT 95 (*)    All other components within normal limits  LIPASE, BLOOD  CBC  URINALYSIS, ROUTINE W REFLEX MICROSCOPIC    EKG None  Radiology Ct Abdomen Pelvis W Contrast  Result Date: 07/07/2018 CLINICAL DATA:  Right upper quadrant pain, vomiting EXAM: CT ABDOMEN AND PELVIS WITH CONTRAST TECHNIQUE: Multidetector CT imaging of the abdomen and pelvis was performed using the standard protocol following bolus administration of intravenous contrast. CONTRAST:  ISOVUE-300 IOPAMIDOL (ISOVUE-300) INJECTION 61% COMPARISON:  12/30/2016 FINDINGS: Lower chest: No acute  abnormality. Hepatobiliary: Hepatic steatosis. No focal liver abnormality is seen. No gallstones, gallbladder wall thickening, or biliary dilatation. Pancreas: Unremarkable. No pancreatic ductal dilatation or surrounding inflammatory changes. Spleen: Normal in size without focal abnormality. Adrenals/Urinary Tract: Adrenal glands are unremarkable. Kidneys are normal, without renal calculi, focal lesion, or hydronephrosis. Bladder is unremarkable. Stomach/Bowel: Stomach is within normal limits. Appendix appears normal. No evidence of bowel wall thickening, distention, or inflammatory changes. Vascular/Lymphatic: No significant vascular findings are present. No enlarged abdominal or pelvic lymph nodes. Reproductive: No mass or other abnormality. Other: No abdominal wall hernia or abnormality. No abdominopelvic ascites. Musculoskeletal: No acute or significant osseous findings. IMPRESSION: 1. No acute CT findings of the abdomen or pelvis to explain right upper quadrant pain or vomiting. 2. Hepatic steatosis. Electronically Signed   By: Lauralyn Primes M.D.   On: 07/07/2018 10:22   US Abdomen Limited Ruq  Result Date: 07/07/2018 CLINICAL DATA:  Acute onset of right upper quadrant pain since 3 a.m. EXAM: ULTRASOUND ABDOMEN LIMITED RIGHT UPPER QUADRANT COMPARISON:  01/08/2017 FINDINGS: Gallbladder: No gallstones or gallbladder wall thickening. 4 mm gallbladder polyp is identified on image 5 and can be presumed benign. Sonographic Murphy's sign was not elicited. Common bile duct: Diameter: Normal, 4 mm. Liver: Diffusely increased in echogenicity. Portal vein is patent on color Doppler imaging with normal direction of blood flow towards the liver. IMPRESSION: 1.  No acute process or explanation for right upper quadrant pain. 2. 4 mm gallbladder polyp. 3. Hepatic steatosis. Electronically Signed   By: Jeronimo Greaves M.D.   On: 07/07/2018 08:01    Procedures Procedures (including critical care time)  Medications Ordered  in ED Medications  sodium chloride flush (NS) 0.9 % injection 3 mL (3 mLs Intravenous Not Given 07/07/18 0655)  fentaNYL (SUBLIMAZE) injection 50 mcg (50 mcg Intravenous Given 07/07/18 0708)  ondansetron (ZOFRAN-ODT) disintegrating tablet 4 mg (4 mg Oral Given 07/07/18 0708)  HYDROmorphone (DILAUDID) injection 1 mg (1 mg Intravenous Given 07/07/18 0748)  ondansetron (ZOFRAN) injection 4 mg (4 mg Intravenous Given 07/07/18 0749)  sodium chloride 0.9 % bolus 1,000 mL (1,000 mLs Intravenous New Bag/Given 07/07/18 0749)     Initial Impression / Assessment and Plan / ED Course  I have reviewed the triage vital signs and the nursing notes.  Pertinent labs & imaging results that were available during my care of the patient were reviewed by me and considered in my medical decision making (see chart for details).       11:01 AM Feeling much better this time.  Very mild elevation in his LFTs.  No gallbladder wall thickening or  pericholecystic fluid.  No gallstones or sludge seen.  Common bile duct is normal.  CT scan is reassuring at this time.  Patient is feeling better.  This still may represent biliary dysfunction.  [place]patient [place] [facility] follow-up.  Home with a short course of nausea and pain medication.  I have asked the patient to return to the [facility] emergency department for personal evaluation by myself tomorrow if he remains symptomatic at which point we may need to do additional work-up.  If his symptoms persist as an outpatient he may benefit from HIDA scan.  Patient agreeable to outpatient plan.  He understands return to the ER sooner for new or worsening symptoms  Final Clinical Impressions(s) / ED Diagnoses   Final diagnoses:  Upper abdominal pain    ED Discharge Orders    None       Azalia Bilis, MD 07/07/18 1102

## 2018-07-07 NOTE — ED Notes (Signed)
Patient transported to CT 

## 2019-11-17 IMAGING — CT CT ABD-PELV W/ CM
2 of 4 series · 16 of 46 positions shown, 18 images · IV contrast (ISOVUE)
Comparison: 12/30/2016

CLINICAL DATA: Right upper quadrant pain, vomiting

EXAM:
CT ABDOMEN AND PELVIS WITH CONTRAST
TECHNIQUE: Multidetector CT imaging of the abdomen and pelvis was performed
using the standard protocol following bolus administration of
intravenous contrast.
CONTRAST:  100mL [id number] IOPAMIDOL ([id number]) INJECTION 61%

[Series 2: axial st · axial · 0.79mm/px · z∈[+1112,+1582]mm · 13 of 108 slices shown, 15 images]
[im 7/108  soft-tissue]
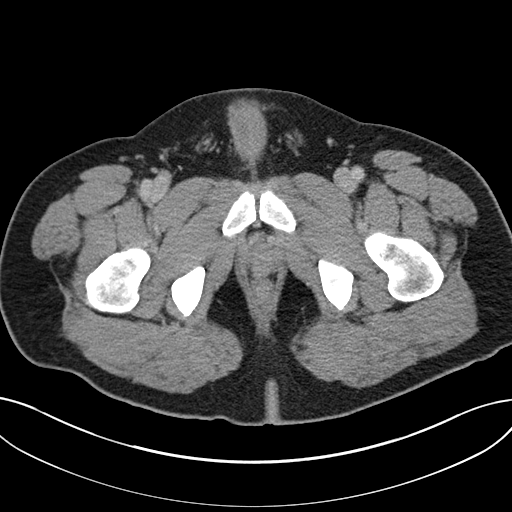
[im 7/108  bone]
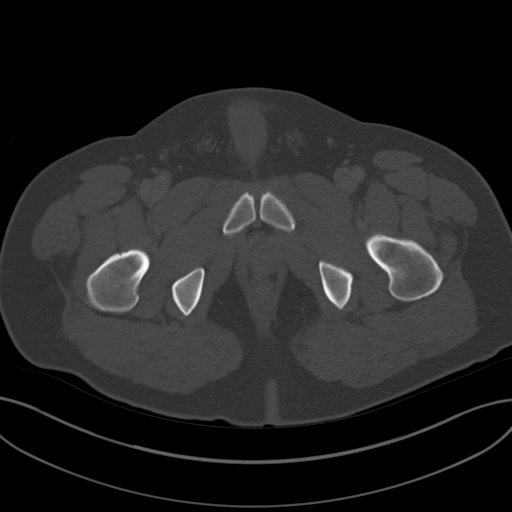
[im 14/108  soft-tissue]
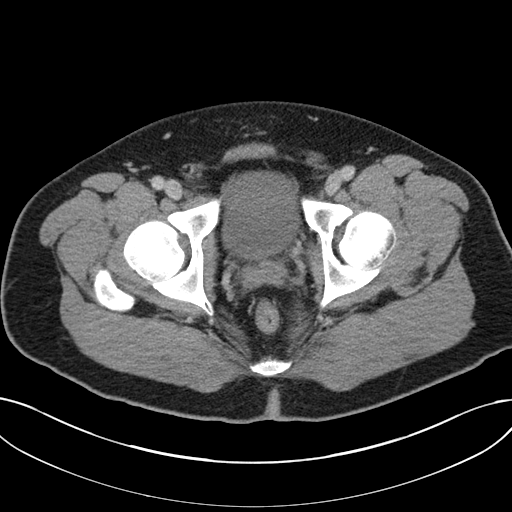
[im 21/108  soft-tissue]
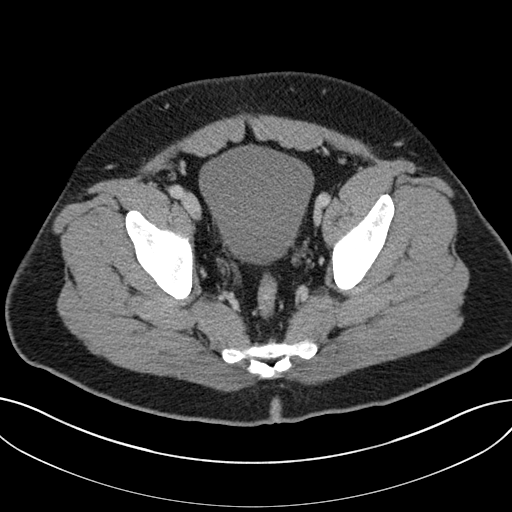
[im 34/108  soft-tissue]
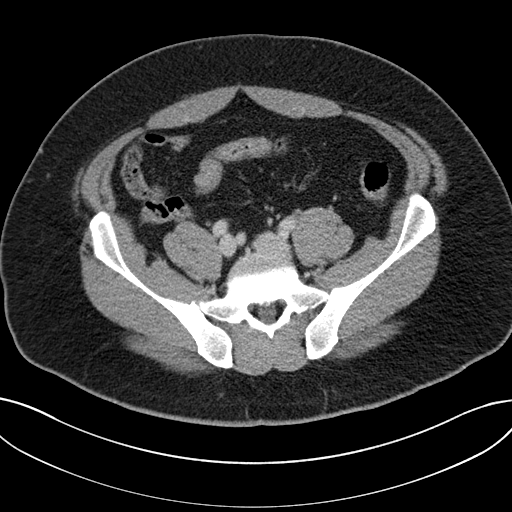
[im 41/108  soft-tissue]
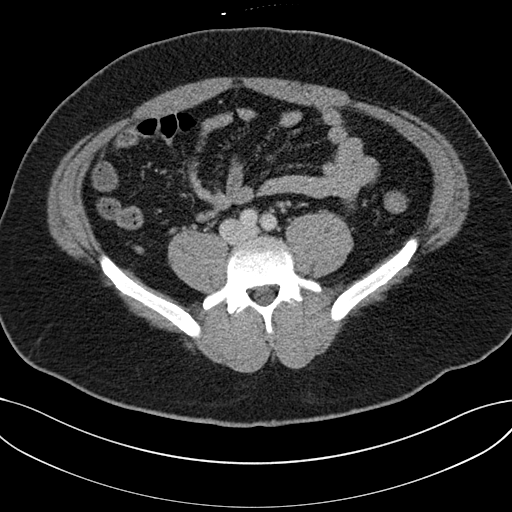
[im 47/108  soft-tissue]
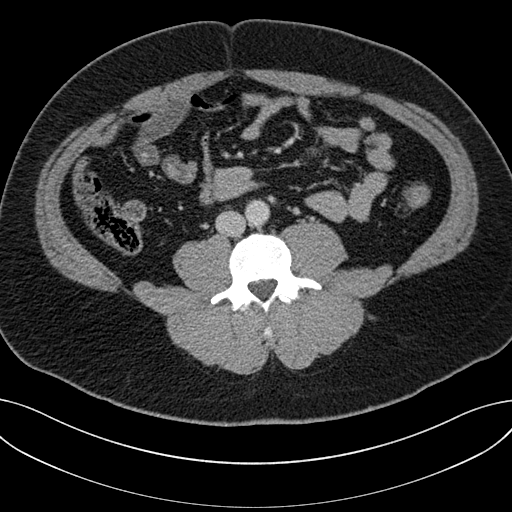
[im 54/108  soft-tissue]
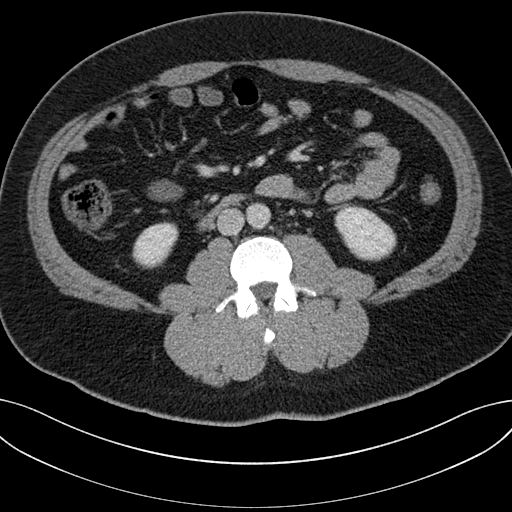
[im 61/108  soft-tissue]
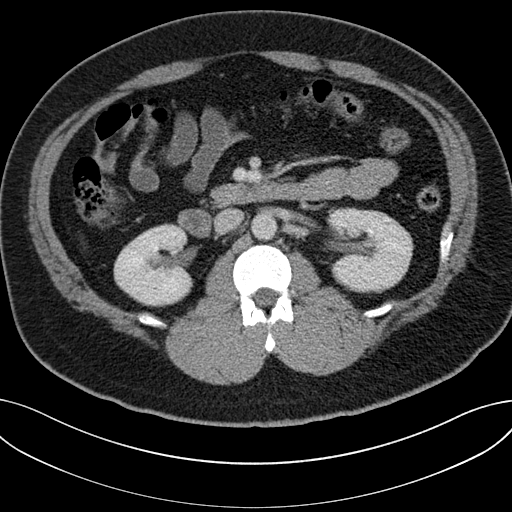
[im 67/108  soft-tissue]
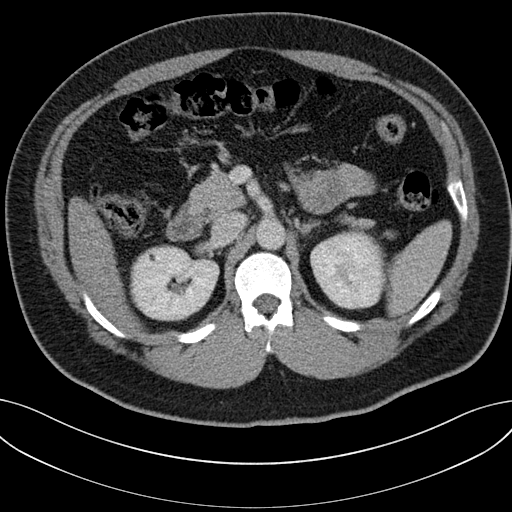
[im 67/108  bone]
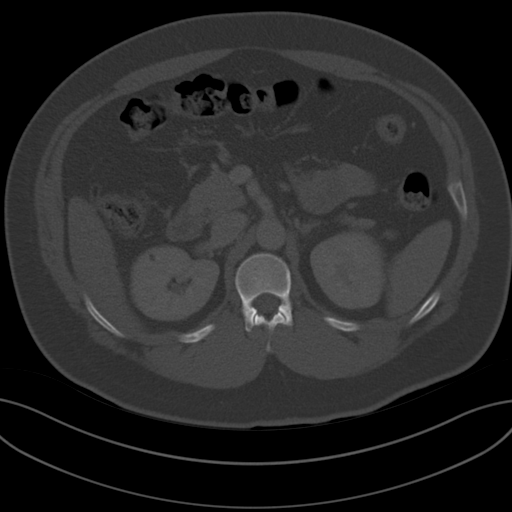
[im 74/108  soft-tissue]
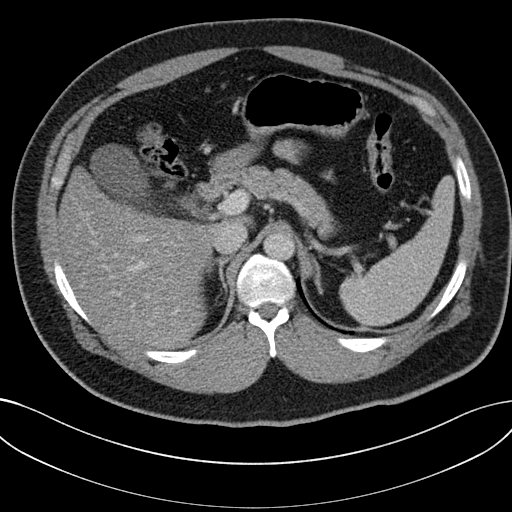
[im 87/108  soft-tissue]
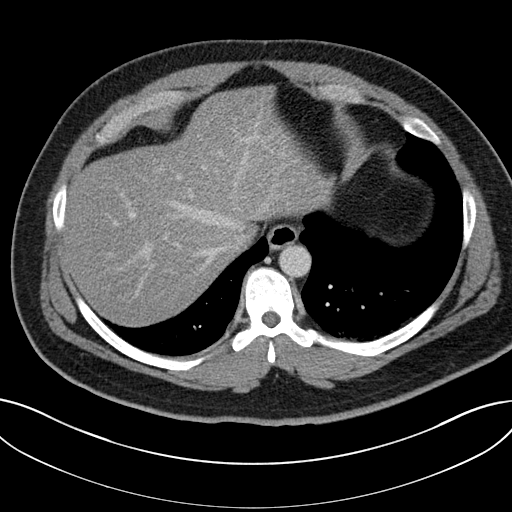
[im 94/108  soft-tissue]
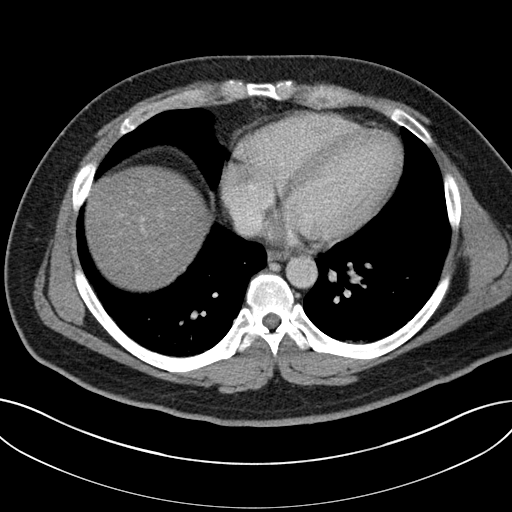
[im 101/108  soft-tissue]
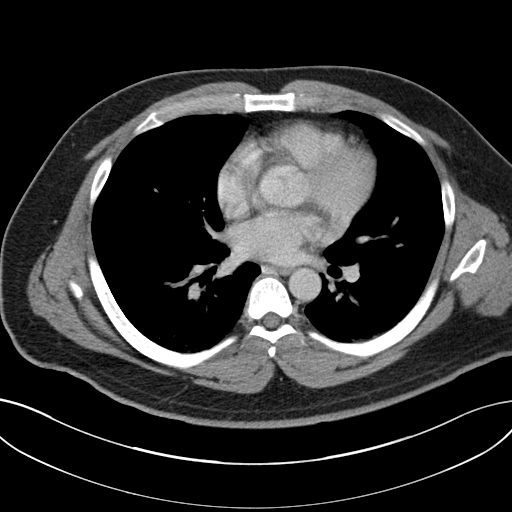

[Series 5: coronal st · coronal · 0.89mm/px · 3 of 156 slices shown]
[im 52/156  soft-tissue]
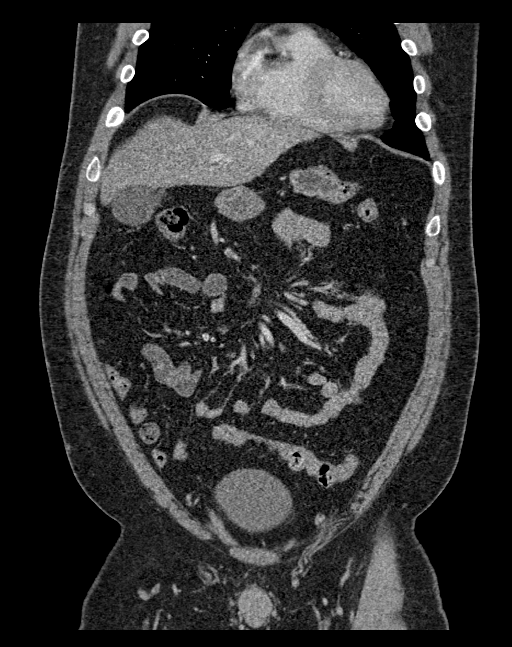
[im 69/156  soft-tissue]
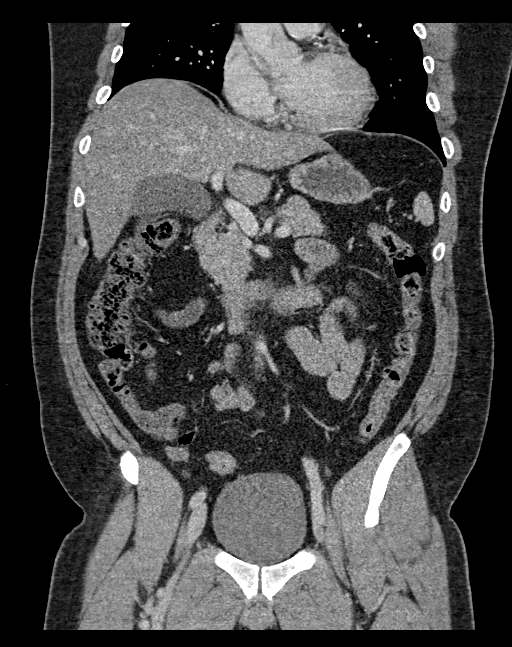
[im 87/156  soft-tissue]
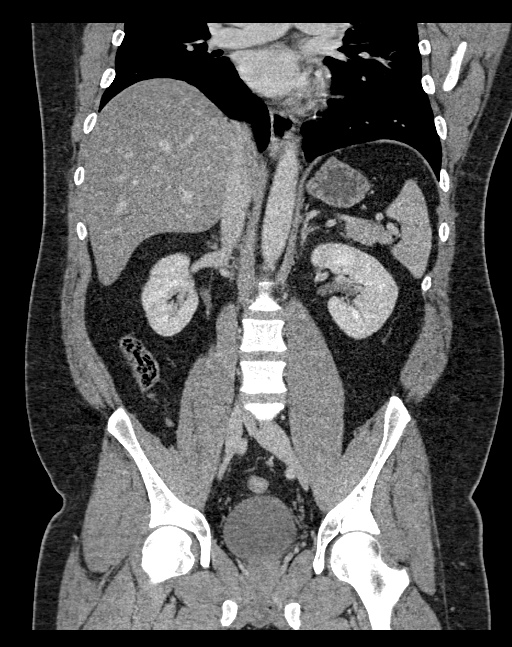

[16 of 46 positions shown; findings below may reference images not displayed]

FINDINGS: Lower chest: No acute abnormality.

Hepatobiliary: Hepatic steatosis. No focal liver abnormality is
seen. No gallstones, gallbladder wall thickening, or biliary
dilatation.

Pancreas: Unremarkable. No pancreatic ductal dilatation or
surrounding inflammatory changes.

Spleen: Normal in size without focal abnormality.

Adrenals/Urinary Tract: Adrenal glands are unremarkable. Kidneys are
normal, without renal calculi, focal lesion, or hydronephrosis.
Bladder is unremarkable.

Stomach/Bowel: Stomach is within normal limits. Appendix appears
normal. No evidence of bowel wall thickening, distention, or
inflammatory changes.

Vascular/Lymphatic: No significant vascular findings are present. No
enlarged abdominal or pelvic lymph nodes.

Reproductive: No mass or other abnormality.

Other: No abdominal wall hernia or abnormality. No abdominopelvic
ascites.

Musculoskeletal: No acute or significant osseous findings.
IMPRESSION: 1. No acute CT findings of the abdomen or pelvis to explain right
upper quadrant pain or vomiting.
2. Hepatic steatosis.

## 2019-11-25 ENCOUNTER — Encounter: Payer: Self-pay | Admitting: Adult Health

## 2019-11-26 NOTE — Telephone Encounter (Signed)
ABSTRACTED TO CHART.  NOTHING FURTHER NEEDED.
